# Patient Record
Sex: Male | Born: 1985 | Race: Black or African American | Hispanic: No | Marital: Single | Smoking: Current every day smoker
Health system: Southern US, Community
[De-identification: ages and names within clinical notes are randomized; demographics above are authoritative.]

---
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
## 2006-09-16 ENCOUNTER — Emergency Department (HOSPITAL_COMMUNITY): Admission: EM | Admit: 2006-09-16 | Discharge: 2006-09-16 | Payer: Self-pay | Admitting: Family Medicine

## 2006-11-21 ENCOUNTER — Emergency Department (HOSPITAL_COMMUNITY): Admission: EM | Admit: 2006-11-21 | Discharge: 2006-11-21 | Payer: Self-pay | Admitting: Family Medicine

## 2007-03-27 ENCOUNTER — Emergency Department (HOSPITAL_COMMUNITY): Admission: EM | Admit: 2007-03-27 | Discharge: 2007-03-27 | Payer: Self-pay | Admitting: Emergency Medicine

## 2007-04-27 ENCOUNTER — Emergency Department (HOSPITAL_COMMUNITY): Admission: EM | Admit: 2007-04-27 | Discharge: 2007-04-27 | Payer: Self-pay | Admitting: Emergency Medicine

## 2008-02-13 ENCOUNTER — Emergency Department (HOSPITAL_COMMUNITY): Admission: EM | Admit: 2008-02-13 | Discharge: 2008-02-13 | Payer: Self-pay | Admitting: Emergency Medicine
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
## 2011-06-15 LAB — URINALYSIS, ROUTINE W REFLEX MICROSCOPIC
Bilirubin Urine: NEGATIVE
Glucose, UA: NEGATIVE
Ketones, ur: 15 — AB
Leukocytes, UA: NEGATIVE
pH: 8.5 — ABNORMAL HIGH

## 2015-08-09 ENCOUNTER — Emergency Department (HOSPITAL_COMMUNITY)
Admission: EM | Admit: 2015-08-09 | Discharge: 2015-08-09 | Disposition: A | Discharge status: Home / Self Care | Payer: Self-pay | Attending: Emergency Medicine | Admitting: Emergency Medicine

## 2015-08-09 ENCOUNTER — Encounter (HOSPITAL_COMMUNITY): Payer: Self-pay | Admitting: Emergency Medicine

## 2015-08-09 DIAGNOSIS — F17200 Nicotine dependence, unspecified, uncomplicated: Secondary | ICD-10-CM | POA: Insufficient documentation

## 2015-08-09 DIAGNOSIS — J020 Streptococcal pharyngitis: Secondary | ICD-10-CM | POA: Insufficient documentation

## 2015-08-09 LAB — RAPID STREP SCREEN (MED CTR MEBANE ONLY): Streptococcus, Group A Screen (Direct): POSITIVE — AB

## 2015-08-09 MED ORDER — DEXAMETHASONE SODIUM PHOSPHATE 10 MG/ML IJ SOLN
10.0000 mg | Freq: Once | INTRAMUSCULAR | Status: AC
  Administered 2015-08-09: 10 mg via INTRAMUSCULAR
  Filled 2015-08-09: qty 1

## 2015-08-09 MED ORDER — PENICILLIN G BENZATHINE 1200000 UNIT/2ML IM SUSP
1.2000 10*6.[IU] | Freq: Once | INTRAMUSCULAR | Status: AC
  Administered 2015-08-09: 1.2 10*6.[IU] via INTRAMUSCULAR
  Filled 2015-08-09: qty 2

## 2015-08-09 NOTE — ED Notes (Addendum)
Pt states that throat has not been feeling right, with sinus congestion for three days. Coughing up yellow sputum. Pt states "When I lay down, I can't breath-- "

## 2015-08-09 NOTE — ED Notes (Signed)
In with PA to see pt. C/o sore throat, unable to sleep, waking up "not able to breath". Pt in no distress.

## 2015-08-09 NOTE — ED Provider Notes (Signed)
CSN: [id number]     Arrival date & time 08/09/15  0809 History   First MD Initiated Contact with Patient 08/09/15 [phone]     Chief Complaint  Patient presents with  . URI  . Sore Throat     (Consider location/radiation/quality/duration/timing/severity/associated sxs/prior Treatment) HPI Christian Hendrix is a 29 y.o. male with her medical problems, presents to emergency department complaining of sore throat and difficulty breathing at night. Patient states symptoms started 3 days ago. He states that it is difficult for him to swallow. He reports snoring and waking up multiple times at night gasping for air. He denies any fever or chills. He denies any nasal congestion. He denies any cough. He does report some acid reflux symptoms, states "sour taste in mouth." Patient states he has been taking over-the-counter cold medication for the last 2 days with no relief of his symptoms. The patient is a current every day smoker. He denies any other drugs. He denies any recent travel surgeries. He denies any chest pain.  History reviewed. No pertinent past medical history. History reviewed. No pertinent past surgical history. No family history on file. Social History  Substance Use Topics  . Smoking status: Current Every Day Smoker -- 0.30 packs/day  . Smokeless tobacco: None  . Alcohol Use: Yes     Comment: socially    Review of Systems  Constitutional: Negative for fever and chills.  HENT: Positive for sore throat and trouble swallowing. Negative for congestion and mouth sores.   Respiratory: Positive for shortness of breath. Negative for cough and chest tightness.   Cardiovascular: Negative for chest pain, palpitations and leg swelling.  Gastrointestinal: Negative for nausea, vomiting, abdominal pain, diarrhea and abdominal distention.  Musculoskeletal: Negative for myalgias, arthralgias, neck pain and neck stiffness.  Skin: Negative for rash.  Allergic/Immunologic: Negative for immunocompromised  state.  Neurological: Negative for dizziness, weakness, light-headedness, numbness and headaches.      Allergies  Review of patient's allergies indicates not on file.  Home Medications   Prior to Admission medications   Not on File   BP 154/113 mmHg  Pulse 84  Temp(Src) 98.2 F (36.8 C) (Oral)  Resp 20  SpO2 98% Physical Exam  Constitutional: He is oriented to person, place, and time. He appears well-developed and well-nourished. No distress.  HENT:  Head: Normocephalic and atraumatic.  Right Ear: External ear normal.  Left Ear: External ear normal.  Nose: Nose normal.  Tonsils enlarged bilaterally, almost touching. Erythematous. No exudate. No trismus or swelling under the tongue. TMs are normal bilaterally  Eyes: Conjunctivae are normal.  Neck: Normal range of motion. Neck supple.  Cardiovascular: Normal rate, regular rhythm and normal heart sounds.   Pulmonary/Chest: Effort normal. No respiratory distress. He has no wheezes. He has no rales.  No stridor  Musculoskeletal: He exhibits no edema.  Lymphadenopathy:    He has no cervical adenopathy.  Neurological: He is alert and oriented to person, place, and time.  Skin: Skin is warm and dry.  Nursing note and vitals reviewed.   ED Course  Procedures (including critical care time) Labs Review Labs Reviewed  RAPID STREP SCREEN (NOT AT [facility]) - Abnormal; Notable for the following:    Streptococcus, Group A Screen (Direct) POSITIVE (*)    All other components within normal limits    Imaging Review No results found. I have personally reviewed and evaluated these images and lab results as part of my medical decision-making.   EKG Interpretation None  MDM   Final diagnoses:  Strep pharyngitis   Pt with bilaterally enlarged tonsils, sob. Rapid strep positive. Pt has no difficulty swallowing in ED. No respiratory complains or stridor. Will treat with decadron for swelling, and penicillin IM for infection.  Home with salt water gargles, tylenol, motrin, follow up.   Filed Vitals:   08/09/15 0823 08/09/15 0947  BP: 154/113 129/86  Pulse: 84 84  Temp: 98.2 F (36.8 C) 98.1 F (36.7 C)  TempSrc: Oral Oral  Resp: 20 18  SpO2: 98% 98%      Jaynie Crumbleatyana Tylie Golonka, PA-C 08/09/15 1448  Arby BarretteMarcy Pfeiffer, MD 08/17/15 1840

## 2015-08-09 NOTE — Discharge Instructions (Signed)
Tylenol or Motrin for pain. Salt water gargles several times a day. You were given antibiotic shot as well as steroid shot today. This should significantly improve swelling and pain in the next several days. If not improving, follow up with your doctor, urgent care, or return to emergency department.  Strep Throat Strep throat is a bacterial infection of the throat. Your health care provider may call the infection tonsillitis or pharyngitis, depending on whether there is swelling in the tonsils or at the back of the throat. Strep throat is most common during the cold months of the year in children who are 35-29 years of age, but it can happen during any season in people of any age. This infection is spread from person to person (contagious) through coughing, sneezing, or close contact. CAUSES Strep throat is caused by the bacteria called Streptococcus pyogenes. RISK FACTORS This condition is more likely to develop in:  People who spend time in crowded places where the infection can spread easily.  People who have close contact with someone who has strep throat. SYMPTOMS Symptoms of this condition include:  Fever or chills.   Redness, swelling, or pain in the tonsils or throat.  Pain or difficulty when swallowing.  White or yellow spots on the tonsils or throat.  Swollen, tender glands in the neck or under the jaw.  Red rash all over the body (rare). DIAGNOSIS This condition is diagnosed by performing a rapid strep test or by taking a swab of your throat (throat culture test). Results from a rapid strep test are usually ready in a few minutes, but throat culture test results are available after one or two days. TREATMENT This condition is treated with antibiotic medicine. HOME CARE INSTRUCTIONS Medicines  Take over-the-counter and prescription medicines only as told by your health care provider.  Take your antibiotic as told by your health care provider. Do not stop taking the  antibiotic even if you start to feel better.  Have family members who also have a sore throat or fever tested for strep throat. They may need antibiotics if they have the strep infection. Eating and Drinking  Do not share food, drinking cups, or personal items that could cause the infection to spread to other people.  If swallowing is difficult, try eating soft foods until your sore throat feels better.  Drink enough fluid to keep your urine clear or pale yellow. General Instructions  Gargle with a salt-water mixture 3-4 times per day or as needed. To make a salt-water mixture, completely dissolve -1 tsp of salt in 1 cup of warm water.  Make sure that all household members wash their hands well.  Get plenty of rest.  Stay home from school or work until you have been taking antibiotics for 24 hours.  Keep all follow-up visits as told by your health care provider. This is important. SEEK MEDICAL CARE IF:  The glands in your neck continue to get bigger.  You develop a rash, cough, or earache.  You cough up a thick liquid that is green, yellow-brown, or bloody.  You have pain or discomfort that does not get better with medicine.  Your problems seem to be getting worse rather than better.  You have a fever. SEEK IMMEDIATE MEDICAL CARE IF:  You have new symptoms, such as vomiting, severe headache, stiff or painful neck, chest pain, or shortness of breath.  You have severe throat pain, drooling, or changes in your voice.  You have swelling of the neck,  or the skin on the neck becomes red and tender.  You have signs of dehydration, such as fatigue, dry mouth, and decreased urination.  You become increasingly sleepy, or you cannot wake up completely.  Your joints become red or painful.   This information is not intended to replace advice given to you by your health care provider. Make sure you discuss any questions you have with your health care provider.   Document  Released: 08/17/2000 Document Revised: 05/11/2015 Document Reviewed: 12/13/2014 Elsevier Interactive Patient Education [employer]

## 2016-06-26 ENCOUNTER — Encounter (HOSPITAL_BASED_OUTPATIENT_CLINIC_OR_DEPARTMENT_OTHER): Payer: Self-pay | Admitting: *Deleted

## 2016-06-26 ENCOUNTER — Emergency Department (HOSPITAL_BASED_OUTPATIENT_CLINIC_OR_DEPARTMENT_OTHER)
Admission: EM | Admit: 2016-06-26 | Discharge: 2016-06-26 | Disposition: A | Discharge status: Home / Self Care | Payer: Worker's Compensation | Attending: Emergency Medicine | Admitting: Emergency Medicine

## 2016-06-26 DIAGNOSIS — Y9389 Activity, other specified: Secondary | ICD-10-CM | POA: Insufficient documentation

## 2016-06-26 DIAGNOSIS — Y990 Civilian activity done for income or pay: Secondary | ICD-10-CM | POA: Diagnosis not present

## 2016-06-26 DIAGNOSIS — F17200 Nicotine dependence, unspecified, uncomplicated: Secondary | ICD-10-CM | POA: Diagnosis not present

## 2016-06-26 DIAGNOSIS — Z23 Encounter for immunization: Secondary | ICD-10-CM | POA: Insufficient documentation

## 2016-06-26 DIAGNOSIS — S61012A Laceration without foreign body of left thumb without damage to nail, initial encounter: Secondary | ICD-10-CM | POA: Insufficient documentation

## 2016-06-26 DIAGNOSIS — Y929 Unspecified place or not applicable: Secondary | ICD-10-CM | POA: Insufficient documentation

## 2016-06-26 DIAGNOSIS — W268XXA Contact with other sharp object(s), not elsewhere classified, initial encounter: Secondary | ICD-10-CM | POA: Insufficient documentation

## 2016-06-26 MED ORDER — BACITRACIN ZINC 500 UNIT/GM EX OINT
TOPICAL_OINTMENT | Freq: Once | CUTANEOUS | Status: AC
  Administered 2016-06-26: 1 via TOPICAL

## 2016-06-26 MED ORDER — LIDOCAINE HCL 2 % IJ SOLN
5.0000 mL | Freq: Once | INTRAMUSCULAR | Status: AC
  Administered 2016-06-26: 100 mg
  Filled 2016-06-26: qty 20

## 2016-06-26 MED ORDER — TETANUS-DIPHTH-ACELL PERTUSSIS 5-2.5-18.5 LF-MCG/0.5 IM SUSP
0.5000 mL | Freq: Once | INTRAMUSCULAR | Status: AC
  Administered 2016-06-26: 0.5 mL via INTRAMUSCULAR
  Filled 2016-06-26: qty 0.5

## 2016-06-26 NOTE — ED Provider Notes (Signed)
[facility] Note   CSN: [id number] Arrival date & time: 06/26/16  1943  By signing my name below, I, Soijett Blue, attest that this documentation has been prepared under the direction and in the presence of Shawn Joy, PA-C Electronically Signed: Soijett Blue, ED Scribe. 06/26/16. 9:57 PM.   History   Chief Complaint Chief Complaint  Patient presents with  . Laceration    HPI Christian Hendrix is a 30 y.o. male who presents to the Emergency Department complaining of left thumb laceration onset 4:30 PM. Pt notes that he was at work opening a box with a box cutter when he accidentally cut his left thumb. Pt reports that his tetanus vaccination is not UTD at this time. He states that he is having associated symptoms of moderate left thumb pain.  Denies neuro deficits or other injuries.     The history is provided by the patient. No language interpreter was used.    History reviewed. No pertinent past medical history.  There are no active problems to display for this patient.   History reviewed. No pertinent surgical history.     Home Medications    Prior to Admission medications   Not on File    Family History No family history on file.  Social History Social History  Substance Use Topics  . Smoking status: Current Every Day Smoker    Packs/day: 0.30  . Smokeless tobacco: Never Used  . Alcohol use Yes     Comment: socially     Allergies   Review of patient's allergies indicates no known allergies.   Review of Systems Review of Systems  Musculoskeletal: Positive for arthralgias (left thumb pain). Negative for joint swelling.  Skin: Positive for wound (laceration to left thumb). Negative for color change.  Neurological: Negative for weakness and numbness.     Physical Exam Updated Vital Signs BP 125/71   Pulse 80   Temp 98.3 F (36.8 C) (Oral)   Resp 18   Ht 5\' 7"  (1.702 m)   Wt 188 lb (85.3 kg)   SpO2 99%   BMI 29.44 kg/m    Physical Exam  Constitutional: He is oriented to person, place, and time. He appears well-developed and well-nourished. No distress.  HENT:  Head: Normocephalic and atraumatic.  Eyes: EOM are normal.  Neck: Neck supple.  Cardiovascular: Normal rate.   Pulmonary/Chest: Effort normal. No respiratory distress.  Abdominal: He exhibits no distension.  Musculoskeletal: Normal range of motion.       Left hand: He exhibits laceration. He exhibits normal range of motion. Normal sensation noted. Normal strength noted.  1.5 cm V-shaped laceration to the medial to the distal thumb adjacent to the nail. Bleeding controlled. FROM against resistance in left thumb. No sensory deficits. Circulation intact to distal tip.   Neurological: He is alert and oriented to person, place, and time.  Skin: Skin is warm and dry. Laceration noted.  Psychiatric: He has a normal mood and affect. His behavior is normal.  Nursing note and vitals reviewed.   ED Treatments / Results  DIAGNOSTIC STUDIES: Oxygen Saturation is 99% on RA, nl by my interpretation.    COORDINATION OF CARE: 8:49 PM Discussed treatment plan with pt at bedside which includes laceration repair and update tetanus vaccination, and pt agreed to plan.   Procedures .Marland Kitchen.Laceration Repair Date/Time: 06/26/2016 9:39 PM Performed by: Anselm PancoastJOY, SHAWN C Authorized by: Anselm PancoastJOY, SHAWN C   Consent:    Consent obtained:  Verbal   Consent  given by:  Patient   Risks discussed:  Infection, need for additional repair and poor wound healing   Alternatives discussed:  Observation Anesthesia (see MAR for exact dosages):    Anesthesia method:  Nerve block   Block location:  Digital   Block needle gauge:  25 G   Block anesthetic:  Lidocaine 2% w/o epi   Block injection procedure:  Anatomic landmarks identified, anatomic landmarks palpated, introduced needle and incremental injection   Block outcome:  Anesthesia achieved Laceration details:    Location:  Finger    Finger location:  L thumb   Length (cm):  1.5 Repair type:    Repair type:  Simple Pre-procedure details:    Preparation:  Patient was prepped and draped in usual sterile fashion Exploration:    Hemostasis achieved with:  Direct pressure   Wound exploration: wound explored through full range of motion and entire depth of wound probed and visualized     Wound extent: no foreign bodies/material noted and no muscle damage noted     Contaminated: no   Treatment:    Area cleansed with:  Betadine   Amount of cleaning:  Standard   Irrigation solution:  Tap water   Irrigation method:  Tap Skin repair:    Repair method:  Sutures   Suture size:  5-0   Suture material:  Prolene   Suture technique:  Simple interrupted   Number of sutures:  3 Approximation:    Approximation:  Close Post-procedure details:    Dressing:  Sterile dressing and antibiotic ointment   Patient tolerance of procedure:  Tolerated well, no immediate complications .Nerve Block Date/Time: 06/26/2016 9:55 PM Performed by: Anselm Pancoast Authorized by: Anselm Pancoast   Consent:    Consent obtained:  Verbal   Consent given by:  Patient   Risks discussed:  Unsuccessful block and pain   Alternatives discussed:  Alternative treatment Location:    Body area:  Upper extremity   Upper extremity nerve blocked: digital block, left thumb.   Laterality:  Left Pre-procedure details:    Skin preparation:  Povidone-iodine   Preparation: Patient was prepped and draped in usual sterile fashion   Skin anesthesia (see MAR for exact dosages):    Skin anesthesia method:  None Procedure details (see MAR for exact dosages):    Block needle gauge:  25 G   Anesthetic injected:  Lidocaine 2% w/o epi   Steroid injected:  None   Additive injected:  None   Injection procedure:  Anatomic landmarks identified and anatomic landmarks palpated Post-procedure details:    Outcome:  Anesthesia achieved   Patient tolerance of procedure:  Tolerated  well, no immediate complications   (including critical care time)  Medications Ordered in ED Medications  bacitracin ointment (not administered)  Tdap (BOOSTRIX) injection 0.5 mL (0.5 mLs Intramuscular Given 06/26/16 2029)  lidocaine (XYLOCAINE) 2 % (with pres) injection 100 mg (100 mg Infiltration Given by Other 06/26/16 2055)    Initial Impression / Assessment and Plan / ED Course  I have reviewed the triage vital signs and the nursing notes.   Clinical Course    Tetanus updated in ED. Laceration occurred < 12 hours prior to repair. Discussed laceration care with pt and answered questions. Pt to f-u for suture removal in 10 days and wound check sooner should there be signs of dehiscence or infection. Pt is hemodynamically stable with no complaints prior to dc.    Final Clinical Impressions(s) / ED Diagnoses  Final diagnoses:  Laceration of left thumb without foreign body without damage to nail, initial encounter    New Prescriptions New Prescriptions   No medications on file   I personally performed the services described in this documentation, which was scribed in my presence. The recorded information has been reviewed and is accurate.     Anselm Pancoast, PA-C 06/26/16 2211    Geoffery Lyons, MD 06/26/16 2239

## 2016-06-26 NOTE — ED Triage Notes (Signed)
Laceration to his left thumb with a box cutter at work today. Workman's Comp. Drug screen required.

## 2016-06-26 NOTE — Discharge Instructions (Signed)
Remove the bandage after 24 hours. You must wait at least 8 hours after the wound repair to wash the wound. Clean the wound and surrounding area gently with tap water and mild soap. Rinse well and blot dry. Do not scrub the wound, as this may cause the wound edges to come apart. You may shower, but avoid submerging the wound, such as with a bath or swimming. °Clean the wound daily to prevent infection. Reapplication of a topical antibiotic ointment, such as Neosporin, will decrease scab formation and reduce any scarring. You may use Tylenol, naproxen, ibuprofen for pain. ° °Return to the ED in 10 days for suture removal. ° °Return to the ED sooner should the wound edges come apart or signs of infection arise, such as spreading redness, puffiness/swelling, pus draining from the wound, severe increase in pain, or any other major issues. °

## 2016-06-26 NOTE — ED Notes (Signed)
Had patient call supervisor to clarify what kind of drug screen was required for [facility]. Confirmed it was 10 panel drug screen.

## 2016-06-26 NOTE — ED Notes (Signed)
Patient is alert and oriented x3.  He was given DC instructions and follow up visit instructions.  Patient gave verbal understanding.  He was DC ambulatory under his own power to home.  V/S stable.  He was not showing any signs of distress on DC 

## 2018-12-17 ENCOUNTER — Ambulatory Visit (HOSPITAL_COMMUNITY)
Admission: EM | Admit: 2018-12-17 | Discharge: 2018-12-17 | Disposition: A | Discharge status: Home / Self Care | Payer: Self-pay | Attending: Family Medicine | Admitting: Family Medicine

## 2018-12-17 ENCOUNTER — Other Ambulatory Visit: Payer: Self-pay

## 2018-12-17 ENCOUNTER — Encounter (HOSPITAL_COMMUNITY): Payer: Self-pay | Admitting: Emergency Medicine

## 2018-12-17 DIAGNOSIS — R200 Anesthesia of skin: Secondary | ICD-10-CM

## 2018-12-17 DIAGNOSIS — M25531 Pain in right wrist: Secondary | ICD-10-CM

## 2018-12-17 DIAGNOSIS — M25532 Pain in left wrist: Secondary | ICD-10-CM

## 2018-12-17 DIAGNOSIS — R030 Elevated blood-pressure reading, without diagnosis of hypertension: Secondary | ICD-10-CM

## 2018-12-17 MED ORDER — NAPROXEN 375 MG PO TABS: 375.0000 mg | ORAL_TABLET | Freq: Two times a day (BID) | ORAL | 0 refills | Status: AC

## 2018-12-17 NOTE — Discharge Instructions (Signed)
Wrist splints placed Continue conservative management of rest, ice, and elevation Take naproxen as needed for pain relief (may cause abdominal discomfort, ulcers, and GI bleeds avoid taking with other NSAIDs) Work restrictions for 1 week given.  If symptoms persists please follow up with ortho for further evaluation and management Return or go to the ER if you have any new or worsening symptoms (fever, chills, increased pain, redness, swelling, tingling, worsening numbness, etc...)   Blood pressure elevated in office.  Please recheck in 24 hours.  If it continues to be greater than 140/90 please follow up with PCP or Joaquin Courts FNP for further evaluation and management.

## 2018-12-17 NOTE — ED Triage Notes (Addendum)
Reports symptoms for years.  Patient has called out of work for 2 days.  Patient reports having bilateral hand swelling and numbness in both hands and all fingers and thumbs  Patient works several jobs, fed x is busier than usual and repetitive motion.  Patient also works out frequently-unable to do this right now due to complaints.

## 2018-12-17 NOTE — ED Provider Notes (Signed)
[facility]   [id number] 12/17/18 Arrival Time: 1553  CC: Bilateral wrist and hand  SUBJECTIVE:  History from: patient. Christian Hendrix is a 33 y.o. male complains of bilateral wrist pain and hand numbness for the past few years, with exacerbation within the past 2 weeks.  Denies a precipitating event or specific injury.  Works at [employer] and [employer] and reports frequent wrist movements with work activities, particularly with lifting heavy boxes at [employer].  Localizes the pain to the bilateral wrist and hands.  Describes as numbness as constant.  Has tried OTC medications without relief.  Symptoms are made worse with wrist movements.  Reports similar symptoms in the past.  Denies fever, chills, erythema, ecchymosis, effusion, weakness, tingling.      ROS: As per HPI.  History reviewed. No pertinent past medical history. History reviewed. No pertinent surgical history. No Known Allergies No current facility-administered medications on file prior to encounter.    No current outpatient medications on file prior to encounter.   Social History   Socioeconomic History  . Marital status: Single    Spouse name: Not on file  . Number of children: Not on file  . Years of education: Not on file  . Highest education level: Not on file  Occupational History  . Not on file  Social Needs  . Financial resource strain: Not on file  . Food insecurity:    Worry: Not on file    Inability: Not on file  . Transportation needs:    Medical: Not on file    Non-medical: Not on file  Tobacco Use  . Smoking status: Current Every Day Smoker    Packs/day: 0.30  . Smokeless tobacco: Never Used  Substance and Sexual Activity  . Alcohol use: Yes    Comment: socially  . Drug use: No  . Sexual activity: Not on file  Lifestyle  . Physical activity:    Days per week: Not on file    Minutes per session: Not on file  . Stress: Not on file  Relationships  . Social connections:    Talks on  phone: Not on file    Gets together: Not on file    Attends religious service: Not on file    Active member of club or organization: Not on file    Attends meetings of clubs or organizations: Not on file    Relationship status: Not on file  . Intimate partner violence:    Fear of current or ex partner: Not on file    Emotionally abused: Not on file    Physically abused: Not on file    Forced sexual activity: Not on file  Other Topics Concern  . Not on file  Social History Narrative  . Not on file   Family History  Problem Relation Age of Onset  . Hypertension Mother   . Hypertension Father     OBJECTIVE:  Vitals:   12/17/18 1615  BP: (!) 160/97  Pulse: 72  Resp: 18  Temp: 98.6 F (37 C)  TempSrc: Oral  SpO2: 97%    General appearance: Alert; in no acute distress.  Head: NCAT Lungs: CTA bilaterally CV: Radial pulse 2+; cap refill < 2 seconds Musculoskeletal: Bilateral hand and wrist Inspection: Skin warm, dry, clear and intact without obvious erythema, effusion, or ecchymosis.  Palpation: Mildly TTP over anterior wrist ROM: LROM about the wrist Strength: 5/5 elbow flexion, 5/5 elbow extension, unable to assess grip strength +Phalen's test; Negative Tinel's  sign  Sensation: decreased about the bilateral hands, unable to differentiate between sharp and dull over plantar aspects of bilateral hands Skin: warm and dry Neurologic: Ambulates without difficulty Psychological: alert and cooperative; normal mood and affect  ASSESSMENT & PLAN:  1. Pain in both wrists   2. Bilateral hand numbness   3. Elevated blood pressure reading     Meds ordered this encounter  Medications  . naproxen (NAPROSYN) 375 MG tablet    Sig: Take 1 tablet (375 mg total) by mouth 2 (two) times daily.    Dispense:  20 tablet    Refill:  0    Order Specific Question:   Supervising Provider    Answer:   Eustace Moore [[id number]]   Wrist splints placed Continue conservative management  of rest, ice, and elevation Take naproxen as needed for pain relief (may cause abdominal discomfort, ulcers, and GI bleeds avoid taking with other NSAIDs) Work restrictions for 1 week given.  If symptoms persists please follow up with ortho for further evaluation and management Return or go to the ER if you have any new or worsening symptoms (fever, chills, increased pain, redness, swelling, tingling, worsening numbness, etc...)   Blood pressure elevated in office.  Please recheck in 24 hours.  If it continues to be greater than 140/90 please follow up with PCP or Joaquin Courts FNP for further evaluation and management.    Reviewed expectations re: course of current medical issues. Questions answered. Outlined signs and symptoms indicating need for more acute intervention. Patient verbalized understanding. After Visit Summary given.    Rennis Harding, PA-C 12/17/18 1757

## 2023-02-14 ENCOUNTER — Emergency Department (HOSPITAL_BASED_OUTPATIENT_CLINIC_OR_DEPARTMENT_OTHER)
Admission: EM | Admit: 2023-02-14 | Discharge: 2023-02-15 | Disposition: A | Discharge status: Home / Self Care | Payer: Self-pay | Attending: Emergency Medicine | Admitting: Emergency Medicine

## 2023-02-14 ENCOUNTER — Other Ambulatory Visit: Payer: Self-pay

## 2023-02-14 ENCOUNTER — Encounter (HOSPITAL_BASED_OUTPATIENT_CLINIC_OR_DEPARTMENT_OTHER): Payer: Self-pay | Admitting: Emergency Medicine

## 2023-02-14 DIAGNOSIS — R55 Syncope and collapse: Secondary | ICD-10-CM | POA: Insufficient documentation

## 2023-02-14 DIAGNOSIS — R1013 Epigastric pain: Secondary | ICD-10-CM | POA: Insufficient documentation

## 2023-02-14 DIAGNOSIS — R112 Nausea with vomiting, unspecified: Secondary | ICD-10-CM | POA: Insufficient documentation

## 2023-02-14 DIAGNOSIS — R197 Diarrhea, unspecified: Secondary | ICD-10-CM | POA: Insufficient documentation

## 2023-02-14 LAB — LIPASE, BLOOD: Lipase: 26 U/L (ref 11–51)

## 2023-02-14 LAB — COMPREHENSIVE METABOLIC PANEL
ALT: 17 U/L (ref 0–44)
AST: 23 U/L (ref 15–41)
Albumin: 4.1 g/dL (ref 3.5–5.0)
Alkaline Phosphatase: 60 U/L (ref 38–126)
Anion gap: 6 (ref 5–15)
BUN: 9 mg/dL (ref 6–20)
CO2: 24 mmol/L (ref 22–32)
Calcium: 8.9 mg/dL (ref 8.9–10.3)
Chloride: 109 mmol/L (ref 98–111)
Creatinine, Ser: 0.92 mg/dL (ref 0.61–1.24)
GFR, Estimated: >60 mL/min (ref >60)
Glucose, Bld: 146 mg/dL — ABNORMAL HIGH (ref 70–99)
Potassium: 3.4 mmol/L — ABNORMAL LOW (ref 3.5–5.1)
Sodium: 139 mmol/L (ref 135–145)
Total Bilirubin: 0.5 mg/dL (ref 0.3–1.2)
Total Protein: 7.3 g/dL (ref 6.5–8.1)

## 2023-02-14 LAB — CBC
HCT: 45.2 % (ref 39.0–52.0)
Hemoglobin: 14.3 g/dL (ref 13.0–17.0)
MCH: 26.7 pg (ref 26.0–34.0)
MCHC: 31.6 g/dL (ref 30.0–36.0)
MCV: 84.5 fL (ref 80.0–100.0)
Platelets: 220 10*3/uL (ref 150–400)
RBC: 5.35 MIL/uL (ref 4.22–5.81)
RDW: 13.4 % (ref 11.5–15.5)
WBC: 6.8 10*3/uL (ref 4.0–10.5)
nRBC: 0 % (ref 0.0–0.2)

## 2023-02-14 MED ORDER — SODIUM CHLORIDE 0.9 % IV BOLUS
1000.0000 mL | Freq: Once | INTRAVENOUS | Status: AC
  Administered 2023-02-14: 1000 mL via INTRAVENOUS

## 2023-02-14 MED ORDER — ONDANSETRON HCL 4 MG/2ML IJ SOLN
4.0000 mg | Freq: Once | INTRAMUSCULAR | Status: AC
Start: 2023-02-14 — End: 2023-02-14
  Administered 2023-02-14: 4 mg via INTRAVENOUS
  Filled 2023-02-14: qty 2

## 2023-02-14 MED ORDER — ONDANSETRON 4 MG PO TBDP: 4.0000 mg | ORAL_TABLET | Freq: Three times a day (TID) | ORAL | 0 refills | Status: AC | PRN

## 2023-02-14 MED ORDER — PANTOPRAZOLE SODIUM 40 MG IV SOLR
40.0000 mg | Freq: Once | INTRAVENOUS | Status: AC
  Administered 2023-02-14: 40 mg via INTRAVENOUS
  Filled 2023-02-14: qty 10

## 2023-02-14 NOTE — ED Provider Notes (Addendum)
[place] [facility] Provider Note   CSN: [id number] Arrival date & time: 02/14/23  7829     History Chief Complaint  Patient presents with   Loss of Consciousness    Christian Hendrix is a 37 y.o. male patient who presents to the emergency department after a syncope episode occurred just prior to arrival.  Patient states that he door-some corroborants late last night and around 3 AM he woke up with profuse vomiting and diarrhea.  He has been having vomiting and diarrhea for much all day and has not been able to keep any p.o. fluids or solids down.  He is also endorsing epigastric burning which she describes as a very sharp sensation.  He does report shaking chills and diaphoresis.  He denies any chest pain or shortness of breath.   Loss of Consciousness      Home Medications Prior to Admission medications   Medication Sig Start Date End Date Taking? Authorizing Provider  ondansetron (ZOFRAN-ODT) 4 MG disintegrating tablet Take 1 tablet (4 mg total) by mouth every 8 (eight) hours as needed for nausea or vomiting. 02/14/23  Yes Meredeth Ide, Jimya Ciani M, PA-C  naproxen (NAPROSYN) 375 MG tablet Take 1 tablet (375 mg total) by mouth 2 (two) times daily. 12/17/18   Wurst, [place], PA-C      Allergies    Patient has no known allergies.    Review of Systems   Review of Systems  Cardiovascular:  Positive for syncope.  All other systems reviewed and are negative.   Physical Exam Updated Vital Signs BP (!) 130/90 (BP Location: Right Arm)   Pulse 79   Temp 98.2 F (36.8 C) (Oral)   Resp 14   SpO2 100%  Physical Exam Vitals and nursing note reviewed.  Constitutional:      General: He is not in acute distress.    Appearance: Normal appearance.  HENT:     Head: Normocephalic and atraumatic.  Eyes:     General:        Right eye: No discharge.        Left eye: No discharge.  Cardiovascular:     Comments: Regular rate and rhythm.  S1/S2 are distinct  without any evidence of murmur, rubs, or gallops.  Radial pulses are 2+ bilaterally.  Dorsalis pedis pulses are 2+ bilaterally.  No evidence of pedal edema. Pulmonary:     Comments: Clear to auscultation bilaterally.  Normal effort.  No respiratory distress.  No evidence of wheezes, rales, or rhonchi heard throughout. Abdominal:     General: Abdomen is flat. Bowel sounds are normal. There is no distension.     Tenderness: There is abdominal tenderness in the epigastric area. There is no guarding or rebound.  Musculoskeletal:        General: Normal range of motion.     Cervical back: Neck supple.  Skin:    General: Skin is warm and dry.     Findings: No rash.  Neurological:     General: No focal deficit present.     Mental Status: He is alert.  Psychiatric:        Mood and Affect: Mood normal.        Behavior: Behavior normal.     ED Results / Procedures / Treatments   Labs (all labs ordered are listed, but only abnormal results are displayed) Labs Reviewed  COMPREHENSIVE METABOLIC PANEL - Abnormal; Notable for the following components:      Result Value  Potassium 3.4 (*)    Glucose, Bld 146 (*)    All other components within normal limits  LIPASE, BLOOD  CBC  URINALYSIS, ROUTINE W REFLEX MICROSCOPIC    EKG None  Radiology No results found.  Procedures Procedures    Medications Ordered in ED Medications  sodium chloride 0.9 % bolus 1,000 mL (0 mLs Intravenous Stopped 02/14/23 2318)  ondansetron (ZOFRAN) injection 4 mg (4 mg Intravenous Given 02/14/23 2122)  pantoprazole (PROTONIX) injection 40 mg (40 mg Intravenous Given 02/14/23 2123)  sodium chloride 0.9 % bolus 1,000 mL (1,000 mLs Intravenous New Bag/Given 02/14/23 2223)    ED Course/ Medical Decision Making/ A&P Clinical Course as of 02/14/23 2356  Thu Feb 14, 2023  2341 Patient is feeling better after 2 L of fluid.  He has not had any vomiting or diarrhea since he has been here.  Will likely discharge home  with Zofran.  Patient agreeable with this plan.  All questions or concerns addressed. [CF]  2342 CBC Normal [CF]  2342 Lipase, blood Normal. [CF]  2342 Comprehensive metabolic panel(!) Mild hypokalemia.  [CF]    Clinical Course User Index [CF] Teressa Lower, PA-C   {   Click here for ABCD2, HEART and other calculators  Medical Decision Making [name] is a 37 y.o. male patient who presents to the emergency department today for further evaluation of likely gastroenteritis.  Will give the patient at least a liter of fluids, Pepcid, and Zofran.  I will get some labs as well to evaluate for liver injury and AKI in addition to leukocytosis.  I suspect gastroenteritis.  Patient is nontoxic-appearing and overall resting comfortably in the ED.  Vital signs are normal. I am less suspicious this is related to ACS or cardiac in origin.   Patient's lab work today is normal.  Patient is feeling better as highlighted in ED course after 2 L of fluid.  I will give him some Zofran to go home with for nausea and vomiting.  He is safe for discharge at this time.  Strict return precautions were discussed.    Amount and/or Complexity of Data Reviewed Labs: ordered. Decision-making details documented in ED Course.  Risk Prescription drug management.    Final Clinical Impression(s) / ED Diagnoses Final diagnoses:  Nausea and vomiting, unspecified vomiting type  Diarrhea, unspecified type    Rx / DC Orders ED Discharge Orders          Ordered    ondansetron (ZOFRAN-ODT) 4 MG disintegrating tablet  Every 8 hours PRN        02/14/23 2343              Teressa Lower, PA-C 02/14/23 2355    Honor Loh M, PA-C 02/14/23 2356    Vanetta Mulders, MD 02/18/23 1212

## 2023-02-14 NOTE — Discharge Instructions (Signed)
Please take Zofran as needed for nausea and vomiting.  Continue drinking plenty of fluids and getting plenty of rest.  I would stick to a clear liquid diet for a couple of days and slowly incorporate your normal diet as tolerated.  May return to the emergency room if any worsening symptoms.

## 2023-02-14 NOTE — ED Triage Notes (Addendum)
Per EMS:  Pt had syncopal episode at home.  Pt started with N/V/D at 3 am after eating [employer] take out.  EMS started IV, gave NS 500 cc and 4 mg IV Zofran.  No vomiting at this time.  CBG 150, VSS, EKG SR
